# Patient Record
Sex: Female | Born: 1995 | Race: White | Hispanic: No | Marital: Single | Smoking: Never smoker
Health system: Southern US, Community
[De-identification: ages and names within clinical notes are randomized; demographics above are authoritative.]

## PROBLEM LIST (undated history)

## (undated) DIAGNOSIS — T753XXA Motion sickness, initial encounter: Secondary | ICD-10-CM

## (undated) DIAGNOSIS — R109 Unspecified abdominal pain: Secondary | ICD-10-CM

## (undated) DIAGNOSIS — Z973 Presence of spectacles and contact lenses: Secondary | ICD-10-CM

## (undated) HISTORY — DX: Unspecified abdominal pain: R10.9

---
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
## 2014-07-09 HISTORY — PX: WISDOM TOOTH EXTRACTION: SHX21

## 2015-01-13 ENCOUNTER — Ambulatory Visit: Payer: Self-pay | Admitting: Urgent Care

## 2015-01-15 ENCOUNTER — Encounter: Payer: Self-pay | Admitting: Urgent Care

## 2015-01-15 ENCOUNTER — Ambulatory Visit (INDEPENDENT_AMBULATORY_CARE_PROVIDER_SITE_OTHER): Payer: Federal, State, Local not specified - PPO | Admitting: Urgent Care

## 2015-01-15 VITALS — BP 119/81 | HR 74 | Temp 98.5°F | Ht 63.0 in | Wt 116.4 lb

## 2015-01-15 DIAGNOSIS — G8929 Other chronic pain: Secondary | ICD-10-CM

## 2015-01-15 DIAGNOSIS — R1013 Epigastric pain: Secondary | ICD-10-CM

## 2015-01-15 DIAGNOSIS — K589 Irritable bowel syndrome without diarrhea: Secondary | ICD-10-CM | POA: Insufficient documentation

## 2015-01-15 NOTE — Assessment & Plan Note (Signed)
Ellen Barker is a pleasant 19 y.o. female with chronic alternating constipation and diarrhea most likely secondary to IBS.  No alarm features. Will check for celiac disease and iFOBT  Probiotic of choice either align 4 mg daily or VS L #3 twice daily Please get your labs as soon as possible.  We will SEND LETTER with results.

## 2015-01-15 NOTE — Assessment & Plan Note (Signed)
Sherrye PayorMackenzie Baune is a pleasant 19 y.o. female with chronic epigastric abdominal pain.  Trial of PPI has made minimal improvement.  Will treat for gastritis, GERD & IBS.  If no improvement, she will need EGD & possibly colonoscopy to further evaluate.  Protonix 40 mg daily Call if severe pain returns or go directly to the emergency department

## 2015-01-15 NOTE — Patient Instructions (Signed)
Probiotic of choice either align 4 mg daily or VS L #3 twice daily Please get your labs as soon as possible.  We will SEND LETTER with results. Protonix 40 mg daily Call if severe pain returns or go directly to the emergency department

## 2015-01-15 NOTE — Progress Notes (Signed)
Gastroenterology Consultation  Referring Provider:     Rolm GalaGrandis, Heidi, MD  Primary Care Physician:  Rolm GalaGRANDIS, HEIDI, MD Primary Gastroenterologist:  Dr. Servando SnareWohl     Reason for Consultation:     Abd Pain         HPI:   Ellen Barker is a 19 y.o. y/o female referred for consultation & management of abdominal pain by Rolm GalaGRANDIS, HEIDI, MD.  Patient went to steakhouse & had salad & steak 3 weeks ago.  She had severe epigstric pain that radiated to mid-back. [facility] that was reportedly normal.  Every time she eats she feels uncomfortable sometimes sharp pain & nausea.  Episodes 2x per week since May.  Worse at night after dinner at 7pm, pain starts 1868m.  Nothing makes it better except phenergan.  She tried protonix for past 10 days without any change.  No hx heartburn or indigestion.  [facility]  12/13/14 labs show CBC, LFTs, CMP, Amylase, lipase all normal.  Abdominal Ultrasound was normal.  Lots of belching & hiccoughs.  Denies dysphagia or odynophagia.  +chills.  No fever.  She alternates between diarrhea (1-2 loose per day), then no BM for up to 1 week.  She has not tried any medications for this.  Denies melena or rectal bleeding.  Appetite is poor & she has lost 4#.  Excedrin headace 2 twice per week.  H pylori IgG negative.  5/24 upreg neg  Past Medical History  Diagnosis Date  . Abdominal pain     Past Surgical History  Procedure Laterality Date  . Wisdom tooth extraction  07/2014    Prior to Admission medications   Medication Sig Start Date End Date Taking? Authorizing Provider  Norgestimate-Ethinyl Estradiol Triphasic 0.18/0.215/0.25 MG-35 MCG tablet Take 1 tablet by mouth daily. 05/31/14 06/01/15 Yes Historical Provider, MD  pantoprazole (PROTONIX) 40 MG tablet Take 1 tablet by mouth daily. 12/31/14 12/31/15 Yes Historical Provider, MD  promethazine (PHENERGAN) 25 MG tablet Take 1 tablet by mouth every 6 (six) hours as needed.  12/31/14  Yes Historical Provider, MD    Family History  Problem Relation Age of Onset  . Cancer Maternal Grandmother     Breast  . Hypertension Maternal Grandmother   . Heart disease Maternal Grandfather   . COPD Maternal Grandfather   . Hypertension Maternal Grandfather   . Hypertension Paternal Grandfather   . Diabetes Paternal Grandfather   . Colon cancer Neg Hx   . Liver disease Neg Hx     History  Substance Use Topics  . Smoking status: Never Smoker   . Smokeless tobacco: Not on file  . Alcohol Use: 0.0 oz/week    0 Standard drinks or equivalent per week     Comment: Occasional couple drinks/yr   History   Social History Narrative   Lives with both parents & brother.  [employer] & studying Special Ed Forensic scientist(Junior) in [employer] (3 roomates)      Allergies as of 01/15/2015  . (No Known Allergies)    Review of Systems:    All systems reviewed and negative except where noted in HPI.   Physical Exam:  BP 119/81 mmHg  Pulse 74  Temp(Src) 98.5 F (36.9 C) (Oral)  Ht 5\' 3"  (1.6 m)  Wt 116 lb 6.4 oz (52.799 kg)  BMI 20.62 kg/m2  LMP 12/25/2014 Patient's last menstrual period was 12/25/2014. General:   Alert,  Well-developed, well-nourished, pleasant and  cooperative in NAD Accompanied by her mother Head:  Normocephalic and atraumatic. Eyes:  Sclera clear, no icterus.   Conjunctiva pink. Ears:  Normal auditory acuity. Nose:  No deformity, discharge, or lesions. Mouth:  No deformity or lesions,oropharynx pink & moist. Neck:  Supple; no masses or thyromegaly. Lungs:  Respirations even and unlabored.  Clear throughout to auscultation.   No wheezes, crackles, or rhonchi. No acute distress. Heart:  Regular rate and rhythm; no murmurs, clicks, rubs, or gallops. Abdomen:  Normal bowel sounds.  No bruits.  Soft, non-tender and non-distended without masses, hepatosplenomegaly or hernias noted.  No guarding or rebound tenderness.  Negative Carnett sign.  Negative Murphy's sign.   Negative McBurney's point tenderness. Rectal:  Deferred.  Msk:  Symmetrical without gross deformities.  Good, equal movement & strength bilaterally. Pulses:  Normal pulses noted. Extremities:  No clubbing or edema.  No cyanosis. Neurologic:  Alert and oriented x3;  grossly normal neurologically. Skin:  Intact without significant lesions or rashes.  No jaundice. Lymph Nodes:  No significant cervical adenopathy. Psych:  Alert and cooperative. Normal mood and affect.

## 2015-01-16 ENCOUNTER — Encounter: Payer: Federal, State, Local not specified - PPO | Admitting: Urgent Care

## 2015-01-16 ENCOUNTER — Other Ambulatory Visit: Payer: Self-pay | Admitting: Urgent Care

## 2015-01-16 ENCOUNTER — Other Ambulatory Visit (INDEPENDENT_AMBULATORY_CARE_PROVIDER_SITE_OTHER): Payer: Federal, State, Local not specified - PPO

## 2015-01-16 DIAGNOSIS — R1013 Epigastric pain: Secondary | ICD-10-CM

## 2015-01-16 LAB — IFOBT (OCCULT BLOOD): IMMUNOLOGICAL FECAL OCCULT BLOOD TEST: NEGATIVE

## 2015-01-17 NOTE — Addendum Note (Signed)
Addended by: Myrtis Hopping on: 01/17/2015 08:37 AM   Modules accepted: Orders

## 2015-01-20 ENCOUNTER — Telehealth: Payer: Self-pay | Admitting: Urgent Care

## 2015-01-20 NOTE — Telephone Encounter (Signed)
Patient saw Lorenza Burton last week. She is calling for her lab results and what the next step is. Please call and advise. Thank you.

## 2015-01-20 NOTE — Telephone Encounter (Signed)
Called patient back at this time. Explained that labs that she had drawn will not be resulted for approximately 10-14 days, but we will call with results and next step as soon as they are received. She verbalizes understanding.

## 2015-01-27 ENCOUNTER — Telehealth: Payer: Self-pay | Admitting: Urgent Care

## 2015-01-27 DIAGNOSIS — R1084 Generalized abdominal pain: Secondary | ICD-10-CM

## 2015-01-27 NOTE — Telephone Encounter (Signed)
Please let pt know labs are normal-no blood in stool or evidence of celiac disease on labs.  If still with symptoms, she will need EGD & Colonoscopy scheduled. Thanks

## 2015-01-28 ENCOUNTER — Other Ambulatory Visit: Payer: Self-pay | Admitting: Urgent Care

## 2015-01-28 MED ORDER — NA SULFATE-K SULFATE-MG SULF 17.5-3.13-1.6 GM/177ML PO SOLN
1.0000 | ORAL | Status: AC
Start: 2015-01-28 — End: ?

## 2015-01-28 NOTE — Telephone Encounter (Signed)
Called patient back to let her know her labs were normal. However, she stated that on Saturday 01/25/2015 she felt bad with nausea and abdominal pain. Due to her symptoms and your recommendations, I scheduled her colonoscopy and EGD on 02/07/2015 at [facility]. Can you please place her order. Thank you.

## 2015-02-03 ENCOUNTER — Encounter: Payer: Self-pay | Admitting: *Deleted

## 2015-02-06 NOTE — Discharge Instructions (Signed)

## 2015-02-07 ENCOUNTER — Encounter
Admission: RE | Disposition: A | Discharge status: Home / Self Care | Payer: Self-pay | Source: Ambulatory Visit | Attending: Gastroenterology

## 2015-02-07 ENCOUNTER — Ambulatory Visit: Payer: Federal, State, Local not specified - PPO | Admitting: Anesthesiology

## 2015-02-07 ENCOUNTER — Other Ambulatory Visit: Payer: Self-pay | Admitting: Gastroenterology

## 2015-02-07 ENCOUNTER — Ambulatory Visit
Admission: RE | Admit: 2015-02-07 | Discharge: 2015-02-07 | Disposition: A | Discharge status: Home / Self Care | Payer: Federal, State, Local not specified - PPO | Source: Ambulatory Visit | Attending: Gastroenterology | Admitting: Gastroenterology

## 2015-02-07 DIAGNOSIS — K2950 Unspecified chronic gastritis without bleeding: Secondary | ICD-10-CM | POA: Diagnosis not present

## 2015-02-07 DIAGNOSIS — Z8249 Family history of ischemic heart disease and other diseases of the circulatory system: Secondary | ICD-10-CM | POA: Diagnosis not present

## 2015-02-07 DIAGNOSIS — Z79899 Other long term (current) drug therapy: Secondary | ICD-10-CM | POA: Insufficient documentation

## 2015-02-07 DIAGNOSIS — R112 Nausea with vomiting, unspecified: Secondary | ICD-10-CM | POA: Insufficient documentation

## 2015-02-07 DIAGNOSIS — Z803 Family history of malignant neoplasm of breast: Secondary | ICD-10-CM | POA: Insufficient documentation

## 2015-02-07 DIAGNOSIS — Z833 Family history of diabetes mellitus: Secondary | ICD-10-CM | POA: Diagnosis not present

## 2015-02-07 DIAGNOSIS — R1013 Epigastric pain: Secondary | ICD-10-CM | POA: Diagnosis not present

## 2015-02-07 DIAGNOSIS — Z836 Family history of other diseases of the respiratory system: Secondary | ICD-10-CM | POA: Diagnosis not present

## 2015-02-07 HISTORY — DX: Motion sickness, initial encounter: T75.3XXA

## 2015-02-07 HISTORY — DX: Presence of spectacles and contact lenses: Z97.3

## 2015-02-07 HISTORY — PX: ESOPHAGOGASTRODUODENOSCOPY (EGD) WITH PROPOFOL: SHX5813

## 2015-02-07 SURGERY — ESOPHAGOGASTRODUODENOSCOPY (EGD) WITH PROPOFOL
Anesthesia: Monitor Anesthesia Care | Wound class: Clean Contaminated

## 2015-02-07 MED ORDER — STERILE WATER FOR IRRIGATION IR SOLN
Status: DC | PRN
  Administered 2015-02-07: 12:00:00

## 2015-02-07 MED ORDER — ACETAMINOPHEN 160 MG/5ML PO SOLN: 325.0000 mg | ORAL | Status: DC | PRN

## 2015-02-07 MED ORDER — LACTATED RINGERS IV SOLN
INTRAVENOUS | Status: DC
  Administered 2015-02-07: 11:00:00 via INTRAVENOUS

## 2015-02-07 MED ORDER — LIDOCAINE HCL (CARDIAC) 20 MG/ML IV SOLN
INTRAVENOUS | Status: DC | PRN
  Administered 2015-02-07: 40 mg via INTRAVENOUS

## 2015-02-07 MED ORDER — ONDANSETRON HCL 4 MG/2ML IJ SOLN
4.0000 mg | Freq: Once | INTRAMUSCULAR | Status: AC
  Administered 2015-02-07: 4 mg via INTRAVENOUS

## 2015-02-07 MED ORDER — PROPOFOL 10 MG/ML IV BOLUS
INTRAVENOUS | Status: DC | PRN
  Administered 2015-02-07: 50 mg via INTRAVENOUS
  Administered 2015-02-07: 100 mg via INTRAVENOUS

## 2015-02-07 MED ORDER — ACETAMINOPHEN 325 MG PO TABS: 325.0000 mg | ORAL_TABLET | ORAL | Status: DC | PRN

## 2015-02-07 MED ORDER — GLYCOPYRROLATE 0.2 MG/ML IJ SOLN
INTRAMUSCULAR | Status: DC | PRN
  Administered 2015-02-07: 0.2 mg via INTRAVENOUS

## 2015-02-07 SURGICAL SUPPLY — 39 items

## 2015-02-07 NOTE — Anesthesia Postprocedure Evaluation (Signed)
  Anesthesia Post-op Note  Patient: Ellen Barker  Procedure(s) Performed: Procedure(s) with comments: ESOPHAGOGASTRODUODENOSCOPY (EGD) WITH PROPOFOL (N/A) - with biopsy  Anesthesia type:MAC  Patient location: PACU  Post pain: Pain level controlled  Post assessment: Post-op Vital signs reviewed, Patient's Cardiovascular Status Stable, Respiratory Function Stable, Patent Airway and No signs of Nausea or vomiting  Post vital signs: Reviewed and stable  Last Vitals:  Filed Vitals:   02/07/15 1230  BP: 101/68  Pulse: 96  Temp:   Resp: 16    Level of consciousness: awake, alert  and patient cooperative  Complications: No apparent anesthesia complications

## 2015-02-07 NOTE — Anesthesia Preprocedure Evaluation (Signed)
Anesthesia Evaluation  Patient identified by MRN, date of birth, ID band  Reviewed: Allergy & Precautions, H&P , NPO status , Patient's Chart, lab work & pertinent test results  Airway Mallampati: I  TM Distance: >3 FB Neck ROM: full    Dental no notable dental hx.    Pulmonary    Pulmonary exam normal       Cardiovascular Rhythm:regular Rate:Normal     Neuro/Psych    GI/Hepatic   Endo/Other    Renal/GU      Musculoskeletal   Abdominal   Peds  Hematology   Anesthesia Other Findings   Reproductive/Obstetrics                             Anesthesia Physical Anesthesia Plan  ASA: II  Anesthesia Plan: MAC   Post-op Pain Management:    Induction:   Airway Management Planned:   Additional Equipment:   Intra-op Plan:   Post-operative Plan:   Informed Consent: I have reviewed the patients History and Physical, chart, labs and discussed the procedure including the risks, benefits and alternatives for the proposed anesthesia with the patient or authorized representative who has indicated his/her understanding and acceptance.     Plan Discussed with: CRNA  Anesthesia Plan Comments:         Anesthesia Quick Evaluation

## 2015-02-07 NOTE — H&P (Signed)
  [facility]  [address], [place], [place] [address] Phone: [phone] Fax : [phone]  Primary Care Physician:  Hortencia Pilar, MD Primary Gastroenterologist:  Dr. Allen Norris  Pre-Procedure History & Physical: HPI:  Ellen Barker is a 19 y.o. female is here for an endoscopy.   Past Medical History  Diagnosis Date  . Abdominal pain   . Motion sickness     any moving vehicle/ride  . Wears contact lenses     Past Surgical History  Procedure Laterality Date  . Wisdom tooth extraction  07/2014    Prior to Admission medications   Medication Sig Start Date End Date Taking? Authorizing Provider  promethazine (PHENERGAN) 25 MG tablet Take 1 tablet by mouth every 6 (six) hours as needed. 12/31/14  Yes Historical Provider, MD  Na Sulfate-K Sulfate-Mg Sulf (SUPREP BOWEL PREP) SOLN Take 1 kit by mouth as directed. Patient not taking: Reported on 02/07/2015 01/28/15   Andria Meuse, NP  Norgestimate-Ethinyl Estradiol Triphasic 0.18/0.215/0.25 MG-35 MCG tablet Take 1 tablet by mouth daily. 05/31/14 06/01/15  Historical Provider, MD  pantoprazole (PROTONIX) 40 MG tablet Take 1 tablet by mouth daily. 12/31/14 12/31/15  Historical Provider, MD    Allergies as of 01/28/2015  . (No Known Allergies)    Family History  Problem Relation Age of Onset  . Cancer Maternal Grandmother     Breast  . Hypertension Maternal Grandmother   . Heart disease Maternal Grandfather   . COPD Maternal Grandfather   . Hypertension Maternal Grandfather   . Hypertension Paternal Grandfather   . Diabetes Paternal Grandfather   . Colon cancer Neg Hx   . Liver disease Neg Hx     History   Social History  . Marital Status: Single    Spouse Name: N/A  . Number of Children: 0  . Years of Education: N/A   Occupational History  . waitress, Chiropractor, FT student    Social History Main Topics  . Smoking status: Never Smoker   . Smokeless tobacco: Not on file  . Alcohol Use: 0.0  oz/week    0 Standard drinks or equivalent per week     Comment: Occasional couple drinks/yr  . Drug Use: No  . Sexual Activity: Yes    Birth Control/ Protection: Pill   Other Topics Concern  . Not on file   Social History Narrative   Lives with both parents & brother.  [employer] & studying Special Ed Chief Technology Officer) in [employer] (3 roomates)       Review of Systems: See HPI, otherwise negative ROS  Physical Exam: BP 102/76 mmHg  Temp(Src) 97.7 F (36.5 C) (Temporal)  Ht $R'5\' 3"'Qo$  (1.6 m)  Wt 115 lb (52.164 kg)  BMI 20.38 kg/m2  LMP 01/29/2015 (Approximate) General:   Alert,  pleasant and cooperative in NAD Head:  Normocephalic and atraumatic. Neck:  Supple; no masses or thyromegaly. Lungs:  Clear throughout to auscultation.    Heart:  Regular rate and rhythm. Abdomen:  Soft, nontender and nondistended. Normal bowel sounds, without guarding, and without rebound.   Neurologic:  Alert and  oriented x4;  grossly normal neurologically.  Impression/Plan: Ellen Barker is here for an endoscopy to be performed for N/V  Risks, benefits, limitations, and alternatives regarding  endoscopy have been reviewed with the patient.  Questions have been answered.  All parties agreeable.   Ollen Bowl, MD  02/07/2015, 12:04 PM

## 2015-02-07 NOTE — Anesthesia Procedure Notes (Signed)
Procedure Name: MAC Performed by: Rosina Cressler Pre-anesthesia Checklist: Patient identified, Emergency Drugs available, Suction available, Timeout performed and Patient being monitored Patient Re-evaluated:Patient Re-evaluated prior to inductionOxygen Delivery Method: Nasal cannula Placement Confirmation: positive ETCO2     

## 2015-02-07 NOTE — Transfer of Care (Signed)
Immediate Anesthesia Transfer of Care Note  Patient: Ellen PayorMackenzie Barker  Procedure(s) Performed: Procedure(s) with comments: ESOPHAGOGASTRODUODENOSCOPY (EGD) WITH PROPOFOL (N/A) - with biopsy  Patient Location: PACU  Anesthesia Type: MAC  Level of Consciousness: awake, alert  and patient cooperative  Airway and Oxygen Therapy: Patient Spontanous Breathing and Patient connected to supplemental oxygen  Post-op Assessment: Post-op Vital signs reviewed, Patient's Cardiovascular Status Stable, Respiratory Function Stable, Patent Airway and No signs of Nausea or vomiting  Post-op Vital Signs: Reviewed and stable  Complications: No apparent anesthesia complications

## 2015-02-07 NOTE — Op Note (Signed)
[facility] Gastroenterology Patient Name: Ellen Barker Procedure Date: 02/07/2015 12:09 PM MRN: [id number] Account #: [id number] Date of Birth: 1995/11/21 Admit Type: Outpatient Age: 19 [facility] Gender: Female Note Status: Finalized Procedure:         Upper GI endoscopy Indications:       Epigastric abdominal pain, Nausea with vomiting Providers:         Midge Miniumarren Lethia Donlon, MD Referring MD:      Joselyn ArrowKandice L. Jones (Referring MD) Medicines:         Propofol per Anesthesia Complications:     No immediate complications. Procedure:         Pre-Anesthesia Assessment:                    - Prior to the procedure, a History and Physical was                     performed, and patient medications and allergies were                     reviewed. The patient's tolerance of previous anesthesia                     was also reviewed. The risks and benefits of the procedure                     and the sedation options and risks were discussed with the                     patient. All questions were answered, and informed consent                     was obtained. Prior Anticoagulants: The patient has taken                     no previous anticoagulant or antiplatelet agents. ASA                     Grade Assessment: I - A normal, healthy patient. After                     reviewing the risks and benefits, the patient was deemed                     in satisfactory condition to undergo the procedure.                    After obtaining informed consent, the endoscope was passed                     under direct vision. Throughout the procedure, the                     patient's blood pressure, pulse, and oxygen saturations                     were monitored continuously. The Olympus GIF H180J                     colonscope (J#:[id number]#:[id number]) was introduced through the mouth,                     and advanced to the second part of duodenum. The upper GI  endoscopy  was accomplished without difficulty. The patient                     tolerated the procedure well. Findings:      The examined esophagus was normal.      The entire examined stomach was normal. Biopsies were taken with a cold       forceps for Helicobacter pylori testing.      The examined duodenum was normal. Biopsies were taken with a cold       forceps for histology. Impression:        - Normal esophagus.                    - Normal stomach. Biopsied.                    - Normal examined duodenum. Biopsied. Recommendation:    - Await pathology results. Procedure Code(s): --- Professional ---                    [phone], Esophagogastroduodenoscopy, flexible, transoral;                     with biopsy, single or multiple Diagnosis Code(s): --- Professional ---                    R10.13, Epigastric pain                    R11.2, Nausea with vomiting, unspecified CPT copyright 2014 American Medical Association. All rights reserved. The codes documented in this report are preliminary and upon coder review may  be revised to meet current compliance requirements. Midge Minium, MD 02/07/2015 12:18:08 PM This report has been signed electronically. Number of Addenda: 0 Note Initiated On: 02/07/2015 12:09 PM Total Procedure Duration: 0 hours 2 minutes 55 seconds       [facility]

## 2015-02-10 ENCOUNTER — Encounter: Payer: Self-pay | Admitting: Urgent Care

## 2015-02-11 ENCOUNTER — Encounter: Payer: Self-pay | Admitting: Gastroenterology

## 2015-02-11 ENCOUNTER — Other Ambulatory Visit: Payer: Self-pay | Admitting: Urgent Care

## 2015-02-11 DIAGNOSIS — K589 Irritable bowel syndrome without diarrhea: Secondary | ICD-10-CM

## 2015-02-11 MED ORDER — HYOSCYAMINE SULFATE 0.125 MG SL SUBL: 0.1250 mg | SUBLINGUAL_TABLET | Freq: Three times a day (TID) | SUBLINGUAL | Status: AC

## 2015-02-12 DIAGNOSIS — R1013 Epigastric pain: Secondary | ICD-10-CM | POA: Insufficient documentation

## 2015-02-12 DIAGNOSIS — R112 Nausea with vomiting, unspecified: Secondary | ICD-10-CM | POA: Insufficient documentation

## 2015-02-18 ENCOUNTER — Encounter: Payer: Self-pay | Admitting: Gastroenterology
